# Patient Record
Sex: Male | Born: 1955 | Race: White | Hispanic: No | Marital: Married | Smoking: Never smoker
Health system: Southern US, Community
[De-identification: ages and names within clinical notes are randomized; demographics above are authoritative.]

---

## 2017-06-19 ENCOUNTER — Other Ambulatory Visit: Payer: Self-pay

## 2017-06-19 DIAGNOSIS — N200 Calculus of kidney: Secondary | ICD-10-CM

## 2017-06-19 NOTE — Telephone Encounter (Signed)
Pt requested a refill of hydroxizine and flomax. Pt did not want to make an appt. Pt stated that he just saw you in May. Please advise.

## 2017-06-22 NOTE — Telephone Encounter (Signed)
[facility] chart reviewed-I had recommended a KUB next month for nephrolithiasis.  Orders were put in for refills and KUB.  A pharmacy is not listed.

## 2017-06-25 MED ORDER — TAMSULOSIN HCL 0.4 MG PO CAPS: 0.8000 mg | ORAL_CAPSULE | Freq: Every day | ORAL | 5 refills | Status: DC

## 2017-06-25 MED ORDER — HYDROXYZINE HCL 25 MG PO TABS: 25.0000 mg | ORAL_TABLET | Freq: Every day | ORAL | 5 refills | Status: DC

## 2017-06-25 NOTE — Telephone Encounter (Signed)
LMOM- medication sent to pharmacy. Will need KUB next month.

## 2017-06-29 ENCOUNTER — Telehealth: Payer: Self-pay | Admitting: Urology

## 2017-06-29 NOTE — Telephone Encounter (Signed)
Patient called the office today regarding his prescription hydroxyzine.  The pharmacy filled it at 25mg  and he was currently taking 50mg .    He is going to take 2 tablets at bedtime and would like a new prescription for 50mg  [facility] [address] in [place].    Please contact him if there is a an issue.

## 2017-07-04 MED ORDER — HYDROXYZINE HCL 50 MG PO TABS: 50.0000 mg | ORAL_TABLET | Freq: Every day | ORAL | 5 refills | Status: DC

## 2017-07-04 NOTE — Telephone Encounter (Signed)
Please advise 

## 2017-07-04 NOTE — Telephone Encounter (Signed)
rx was sent

## 2017-09-03 ENCOUNTER — Ambulatory Visit
Admission: RE | Admit: 2017-09-03 | Discharge: 2017-09-03 | Disposition: A | Discharge status: Home / Self Care | Payer: BLUE CROSS/BLUE SHIELD | Source: Ambulatory Visit | Attending: Urology | Admitting: Urology

## 2017-09-03 DIAGNOSIS — N200 Calculus of kidney: Secondary | ICD-10-CM | POA: Insufficient documentation

## 2017-09-19 ENCOUNTER — Telehealth: Payer: Self-pay

## 2017-09-19 NOTE — Telephone Encounter (Signed)
Letter sent.

## 2017-09-19 NOTE — Telephone Encounter (Signed)
-----   Message from Riki AltesScott C Stoioff, MD sent at 09/16/2017 10:42 AM EST ----- When comparing his [facility]UB with [facility] his bilateral renal calculi appear to be stable.  Recommend a repeat KUB in 1 year.

## 2017-12-24 ENCOUNTER — Telehealth: Payer: Self-pay | Admitting: Urology

## 2017-12-24 MED ORDER — TAMSULOSIN HCL 0.4 MG PO CAPS: 0.8000 mg | ORAL_CAPSULE | Freq: Every day | ORAL | 5 refills | Status: DC

## 2017-12-24 NOTE — Telephone Encounter (Signed)
Pt called office LMOM asking for a refill of his Tamsulosin .4mg , states he called for this request 10 days ago to have it "re-sent" Please advise pt @ [phone]. Thanks.

## 2017-12-24 NOTE — Telephone Encounter (Signed)
RX faxed

## 2018-02-12 ENCOUNTER — Telehealth: Payer: Self-pay | Admitting: Urology

## 2018-02-12 MED ORDER — HYDROXYZINE HCL 50 MG PO TABS: 50.0000 mg | ORAL_TABLET | Freq: Every day | ORAL | 1 refills | Status: DC

## 2018-02-12 NOTE — Telephone Encounter (Signed)
Pt scheduled appt w/Stoioff on 8/20.  He will need 1 month of Hydroxyzine [facility] [address]

## 2018-03-12 ENCOUNTER — Ambulatory Visit: Payer: BLUE CROSS/BLUE SHIELD | Admitting: Urology

## 2018-03-12 ENCOUNTER — Other Ambulatory Visit: Payer: Self-pay

## 2018-03-12 ENCOUNTER — Encounter: Payer: Self-pay | Admitting: Urology

## 2018-03-12 VITALS — BP 111/62 | HR 62 | Ht 74.0 in | Wt 222.5 lb

## 2018-03-12 DIAGNOSIS — N200 Calculus of kidney: Secondary | ICD-10-CM | POA: Diagnosis not present

## 2018-03-12 DIAGNOSIS — N401 Enlarged prostate with lower urinary tract symptoms: Secondary | ICD-10-CM

## 2018-03-13 ENCOUNTER — Ambulatory Visit
Admission: RE | Admit: 2018-03-13 | Discharge: 2018-03-13 | Disposition: A | Discharge status: Home / Self Care | Payer: BLUE CROSS/BLUE SHIELD | Source: Ambulatory Visit | Attending: Urology | Admitting: Urology

## 2018-03-13 ENCOUNTER — Encounter: Payer: Self-pay | Admitting: Urology

## 2018-03-13 ENCOUNTER — Telehealth: Payer: Self-pay | Admitting: Urology

## 2018-03-13 DIAGNOSIS — N200 Calculus of kidney: Secondary | ICD-10-CM

## 2018-03-13 LAB — PSA: Prostate Specific Ag, Serum: 2.7 ng/mL (ref 0.0–4.0)

## 2018-03-13 MED ORDER — MIRABEGRON ER 25 MG PO TB24: 25.0000 mg | ORAL_TABLET | Freq: Every day | ORAL | 0 refills | Status: DC

## 2018-03-13 NOTE — Telephone Encounter (Signed)
Patient was seen in the office on 03/12/18 and left without receiving samples of Myrbetriq 25mg .    I pulled 4 sleeves and placed up front for the patient to pick up. Lot: [id number], Exp 04/2020

## 2018-03-13 NOTE — Telephone Encounter (Signed)
KUB order needs to be placed per office note 03/12/18 - patient coming today

## 2018-03-13 NOTE — Addendum Note (Signed)
Addended by: Vickki HearingHOMPSON, Liela Rylee L on: 03/13/2018 12:05 PM   Modules accepted: Orders

## 2018-03-13 NOTE — Progress Notes (Signed)
03/12/2018 7:03 AM   Shawn Orr 05/01/1956 [id number]  Referring provider: Tiana Loftabellon, Melissa, MD [address] [place], [place] [id number]  Chief Complaint  Patient presents with  . Nephrolithiasis   Urologic history: 1.  Bilateral nonobstructing renal calculi; on observation  2.  BPH with lower urinary tract symptoms; on tamsulosin  3.  Prior diagnosis of interstitial cystitis many years ago; takes MRI and periodically along with hydroxyzine and diet   HPI: 62 year old male [facility] problem list.  I last saw him in June 2018.  He had a KUB February 2019 which showed stable bilateral renal calculi.  He remains on tamsulosin 0.8 mg daily.  He does complain of urinary frequency, intermittent urinary stream, hesitancy and nocturia x3-4.  Denies dysuria or gross hematuria.  Since his last visit he has had no episodes of renal colic or denies passing calculi.  He denies pelvic pain.  Last PSA was in February 2017 and was 2.35.  PMH: History reviewed. No pertinent past medical history.  Surgical History: History reviewed. No pertinent surgical history.  Home Medications:  Allergies as of 03/12/2018      Reactions   Minocycline Rash      Medication List        Accurate as of 03/12/18 11:59 PM. Always use your most recent med list.          hydrOXYzine 50 MG tablet Commonly known as:  ATARAX/VISTARIL Take 1 tablet (50 mg total) by mouth at bedtime.   tamsulosin 0.4 MG Caps capsule Commonly known as:  FLOMAX Take 2 capsules (0.8 mg total) by mouth daily.       Allergies:  Allergies  Allergen Reactions  . Minocycline Rash    Family History: History reviewed. No pertinent family history.  Social History:  reports that he has never smoked. He has never used smokeless tobacco. He reports that he does not drink alcohol or use drugs.  ROS: UROLOGY Frequent Urination?: Yes Hard to postpone urination?: No Burning/pain  with urination?: No Get up at night to urinate?: Yes Leakage of urine?: Yes Urine stream starts and stops?: Yes Trouble starting stream?: Yes Do you have to strain to urinate?: No Blood in urine?: No Urinary tract infection?: No Sexually transmitted disease?: No Injury to kidneys or bladder?: No Painful intercourse?: No Weak stream?: No Erection problems?: No Penile pain?: No  Gastrointestinal Nausea?: No Vomiting?: No Indigestion/heartburn?: No Diarrhea?: No Constipation?: No  Constitutional Fever: No Night sweats?: No Weight loss?: No Fatigue?: No  Skin Skin rash/lesions?: No Itching?: No  Eyes Blurred vision?: No Double vision?: No  Ears/Nose/Throat Sore throat?: No Sinus problems?: No  Hematologic/Lymphatic Swollen glands?: No Easy bruising?: No  Cardiovascular Leg swelling?: No Chest pain?: No  Respiratory Cough?: No Shortness of breath?: No  Endocrine Excessive thirst?: No  Musculoskeletal Back pain?: Yes Joint pain?: No  Neurological Headaches?: No Dizziness?: No  Psychologic Depression?: No Anxiety?: No  Physical Exam: BP 111/62   Pulse 62   Ht 6\' 2"  (1.88 m)   Wt 222 lb 8 oz (100.9 kg)   BMI 28.57 kg/m   Constitutional:  Alert and oriented, No acute distress. HEENT:  AT, moist mucus membranes.  Trachea midline, no masses. Cardiovascular: No clubbing, cyanosis, or edema. Respiratory: Normal respiratory effort, no increased work of breathing. GI: Abdomen is soft, nontender, nondistended, no abdominal masses GU: No CVA tenderness.  Prostate 40 g, smooth without nodules Lymph: No cervical or  inguinal lymphadenopathy. Skin: No rashes, bruises or suspicious lesions. Neurologic: Grossly intact, no focal deficits, moving all 4 extremities. Psychiatric: Normal mood and affect.   Assessment & Plan:    1. Benign prostatic hyperplasia with lower urinary tract symptoms, symptom details unspecified Continue tamsulosin.  He does  have bothersome frequency and nocturia.  He has been on Myrbetriq 50 mg in the past however complained of a headache.  He wanted to try the 25 mg dose and was given samples.  He desired to continue PSA testing and will be notified with the results.  He will continue hydroxyzine as he does appear to get some benefit from this medication.  2. Nephrolithiasis KUB was ordered and he will be notified with the results.   Riki AltesScott C Stoioff, MD  [facility] [address], [address] [place], [place] [address] [phone]

## 2018-03-15 ENCOUNTER — Ambulatory Visit
Admission: RE | Admit: 2018-03-15 | Discharge: 2018-03-15 | Disposition: A | Discharge status: Home / Self Care | Payer: BLUE CROSS/BLUE SHIELD | Source: Ambulatory Visit | Attending: Urology | Admitting: Urology

## 2018-03-15 DIAGNOSIS — N200 Calculus of kidney: Secondary | ICD-10-CM | POA: Diagnosis present

## 2018-04-08 ENCOUNTER — Encounter: Payer: Self-pay | Admitting: Urology

## 2018-04-08 ENCOUNTER — Other Ambulatory Visit: Payer: Self-pay | Admitting: Urology

## 2018-04-08 MED ORDER — MIRABEGRON ER 25 MG PO TB24: 25.0000 mg | ORAL_TABLET | Freq: Every day | ORAL | 3 refills | Status: DC

## 2018-04-11 ENCOUNTER — Telehealth: Payer: Self-pay | Admitting: Urology

## 2018-04-11 NOTE — Telephone Encounter (Signed)
Patient is calling and asking for a refill on hydroxyzine, he said that the pharmacy states they sent the request over on the 16th? Wants to know if this can be refilled?  Shawn Orr

## 2018-04-14 MED ORDER — HYDROXYZINE HCL 50 MG PO TABS: 50.0000 mg | ORAL_TABLET | Freq: Every day | ORAL | 3 refills | Status: AC

## 2018-04-14 NOTE — Telephone Encounter (Signed)
rx sent

## 2018-05-27 ENCOUNTER — Encounter: Payer: Self-pay | Admitting: Urology

## 2018-06-27 ENCOUNTER — Telehealth: Payer: Self-pay | Admitting: Urology

## 2018-06-27 NOTE — Telephone Encounter (Signed)
Pt would like to get some more samples of Myrbetriq 25 mg.  Please call pt.  He also wants to know if Elmiron would be something Stoioff could prescribe.  He had an RX from his former urologist.  Please call pt [phone]

## 2018-06-27 NOTE — Telephone Encounter (Signed)
Patient saw you in Aug. Of this year. Please advise on Elmiron?

## 2018-06-30 MED ORDER — PENTOSAN POLYSULFATE SODIUM 100 MG PO CAPS: 100.0000 mg | ORAL_CAPSULE | Freq: Three times a day (TID) | ORAL | 3 refills | Status: AC

## 2018-06-30 NOTE — Telephone Encounter (Signed)
Rx Elmiron sent

## 2018-07-03 ENCOUNTER — Encounter: Payer: Self-pay | Admitting: Urology

## 2018-07-03 ENCOUNTER — Other Ambulatory Visit: Payer: Self-pay

## 2018-07-03 MED ORDER — TAMSULOSIN HCL 0.4 MG PO CAPS: 0.8000 mg | ORAL_CAPSULE | Freq: Every day | ORAL | 3 refills | Status: AC

## 2018-07-03 MED ORDER — MIRABEGRON ER 25 MG PO TB24: 25.0000 mg | ORAL_TABLET | Freq: Every day | ORAL | 3 refills | Status: AC

## 2018-10-03 MED ORDER — ATORVASTATIN CALCIUM 80 MG PO TABS
80.00 | ORAL_TABLET | ORAL | Status: DC
Start: 2018-10-02 — End: 2018-10-03

## 2018-10-03 MED ORDER — ONDANSETRON 4 MG PO TBDP
8.00 | ORAL_TABLET | ORAL | Status: DC
Start: ? — End: 2018-10-03

## 2018-10-03 MED ORDER — HYDROMORPHONE HCL 2 MG PO TABS
8.00 | ORAL_TABLET | ORAL | Status: DC
Start: ? — End: 2018-10-03

## 2018-10-03 MED ORDER — GENERIC EXTERNAL MEDICATION
2.00 | Status: DC
Start: 2018-10-02 — End: 2018-10-03

## 2018-10-03 MED ORDER — TAMSULOSIN HCL 0.4 MG PO CAPS
0.40 | ORAL_CAPSULE | ORAL | Status: DC
Start: 2018-10-03 — End: 2018-10-03

## 2018-10-03 MED ORDER — FENTANYL 50 MCG/HR TD PT72
1.00 | MEDICATED_PATCH | TRANSDERMAL | Status: DC
Start: 2018-10-03 — End: 2018-10-03

## 2018-10-03 MED ORDER — METOPROLOL TARTRATE 25 MG PO TABS
12.50 | ORAL_TABLET | ORAL | Status: DC
Start: 2018-10-02 — End: 2018-10-03

## 2018-10-03 MED ORDER — PANTOPRAZOLE SODIUM 20 MG PO TBEC
20.00 | DELAYED_RELEASE_TABLET | ORAL | Status: DC
Start: 2018-10-03 — End: 2018-10-03

## 2018-10-03 MED ORDER — DRONABINOL 5 MG PO CAPS
5.00 | ORAL_CAPSULE | ORAL | Status: DC
Start: 2018-10-02 — End: 2018-10-03

## 2018-10-03 MED ORDER — ZOLPIDEM TARTRATE 5 MG PO TABS
5.00 | ORAL_TABLET | ORAL | Status: DC
Start: 2018-10-02 — End: 2018-10-03

## 2018-10-03 MED ORDER — POLYETHYLENE GLYCOL 3350 17 G PO PACK
17.00 | PACK | ORAL | Status: DC
Start: 2018-10-02 — End: 2018-10-03

## 2018-10-03 MED ORDER — SUCRALFATE 1 GM/10ML PO SUSP
1.00 | ORAL | Status: DC
Start: 2018-10-02 — End: 2018-10-03

## 2018-10-03 MED ORDER — PROCHLORPERAZINE MALEATE 10 MG PO TABS
10.00 | ORAL_TABLET | ORAL | Status: DC
Start: ? — End: 2018-10-03

## 2018-10-03 MED ORDER — DOCUSATE SODIUM 100 MG PO CAPS
100.00 | ORAL_CAPSULE | ORAL | Status: DC
Start: 2018-10-03 — End: 2018-10-03

## 2018-10-03 MED ORDER — APIXABAN 5 MG PO TABS
5.00 | ORAL_TABLET | ORAL | Status: DC
Start: 2018-10-02 — End: 2018-10-03

## 2018-10-03 MED ORDER — ACETAMINOPHEN 500 MG PO TABS
1000.00 | ORAL_TABLET | ORAL | Status: DC
Start: 2018-10-02 — End: 2018-10-03

## 2019-01-26 IMAGING — CR DG ABDOMEN 1V
1 series · 2 of 2 positions shown · non-contrast
Comparison: 09/03/2017 KUB.

CLINICAL DATA: 62-year-old male with nephrolithiasis. Frequent
urination.

EXAM:
ABDOMEN - 1 VIEW

[Series 1: dg abd 1 view · 0.14mm/px · 2 of 2 slices shown]
[im 1/2]
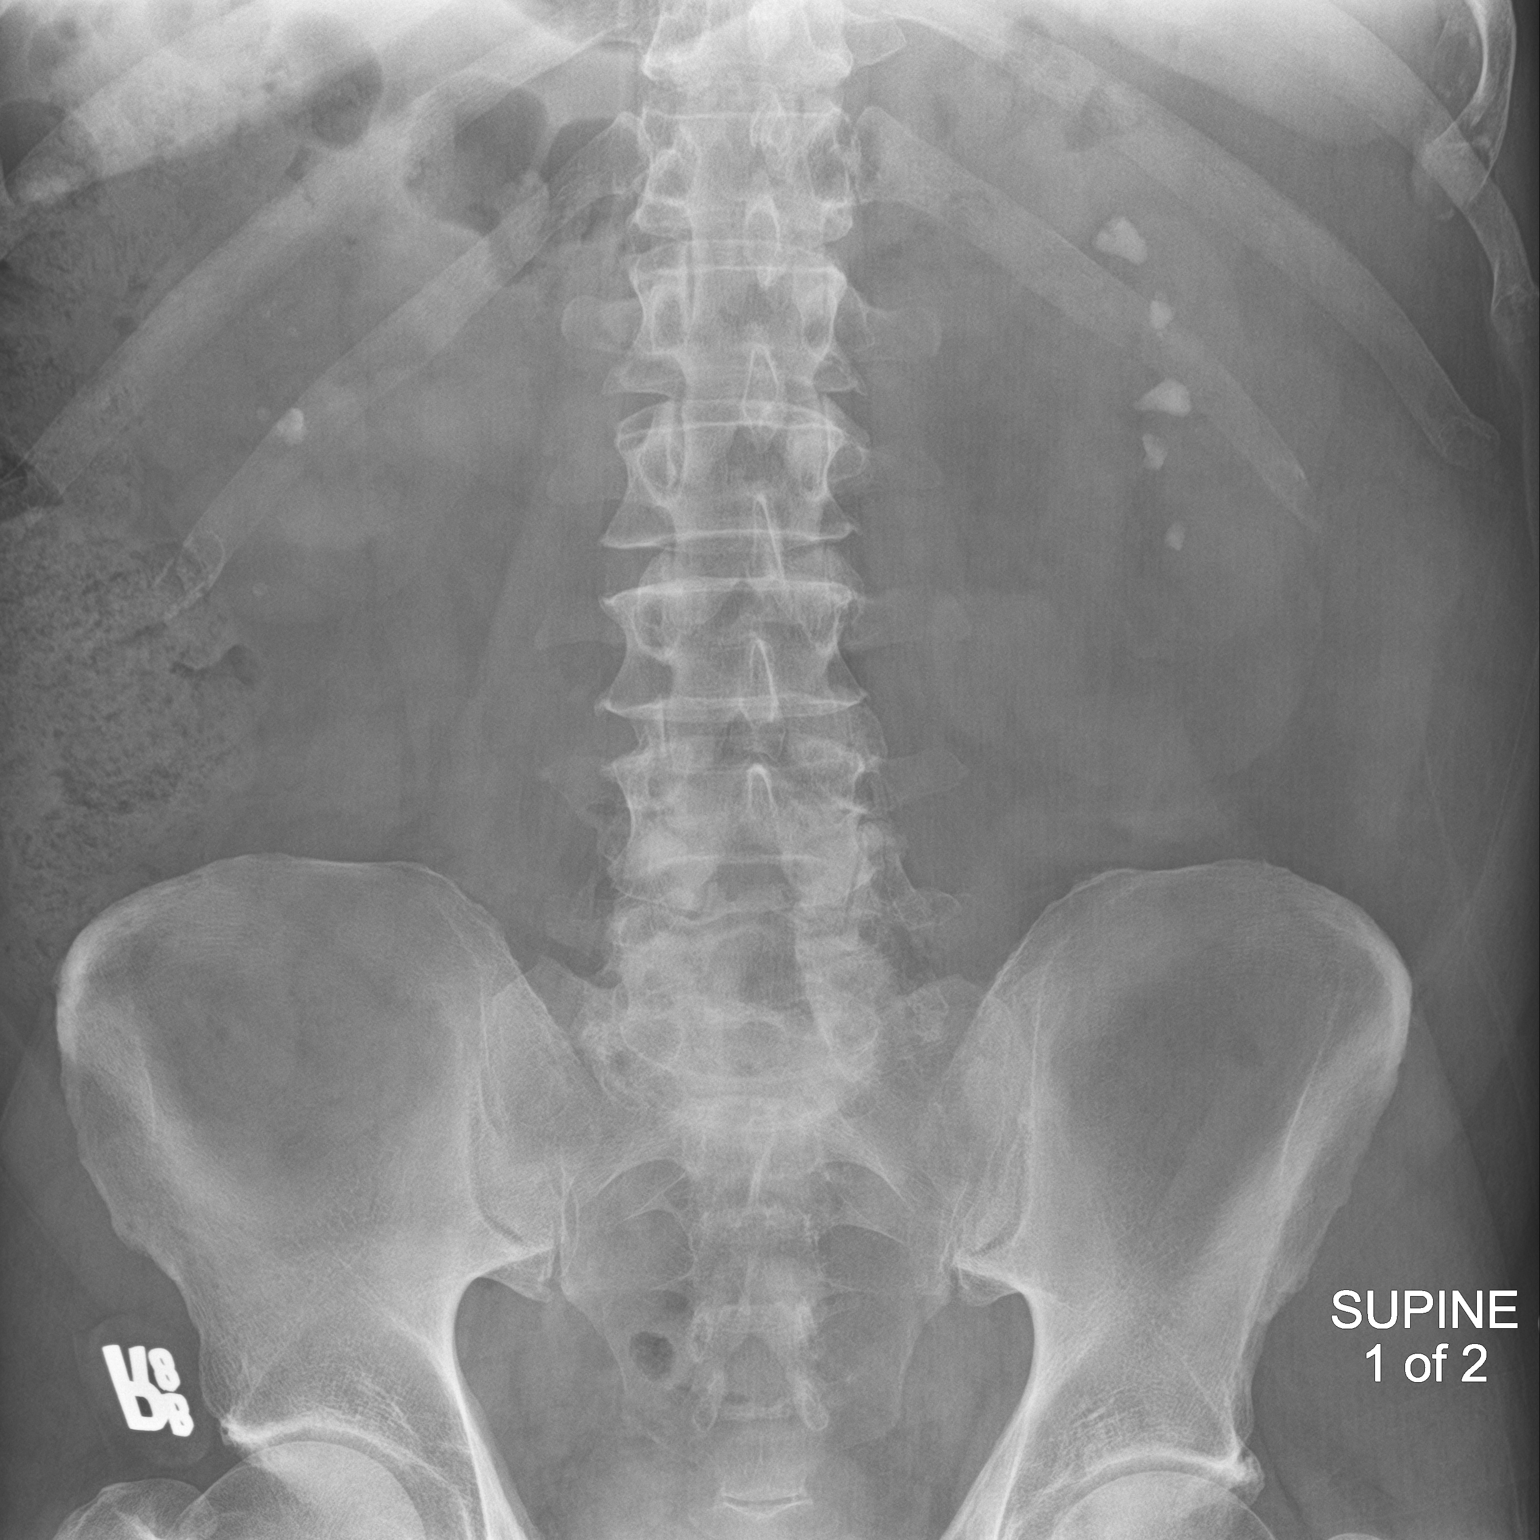
[im 2/2]
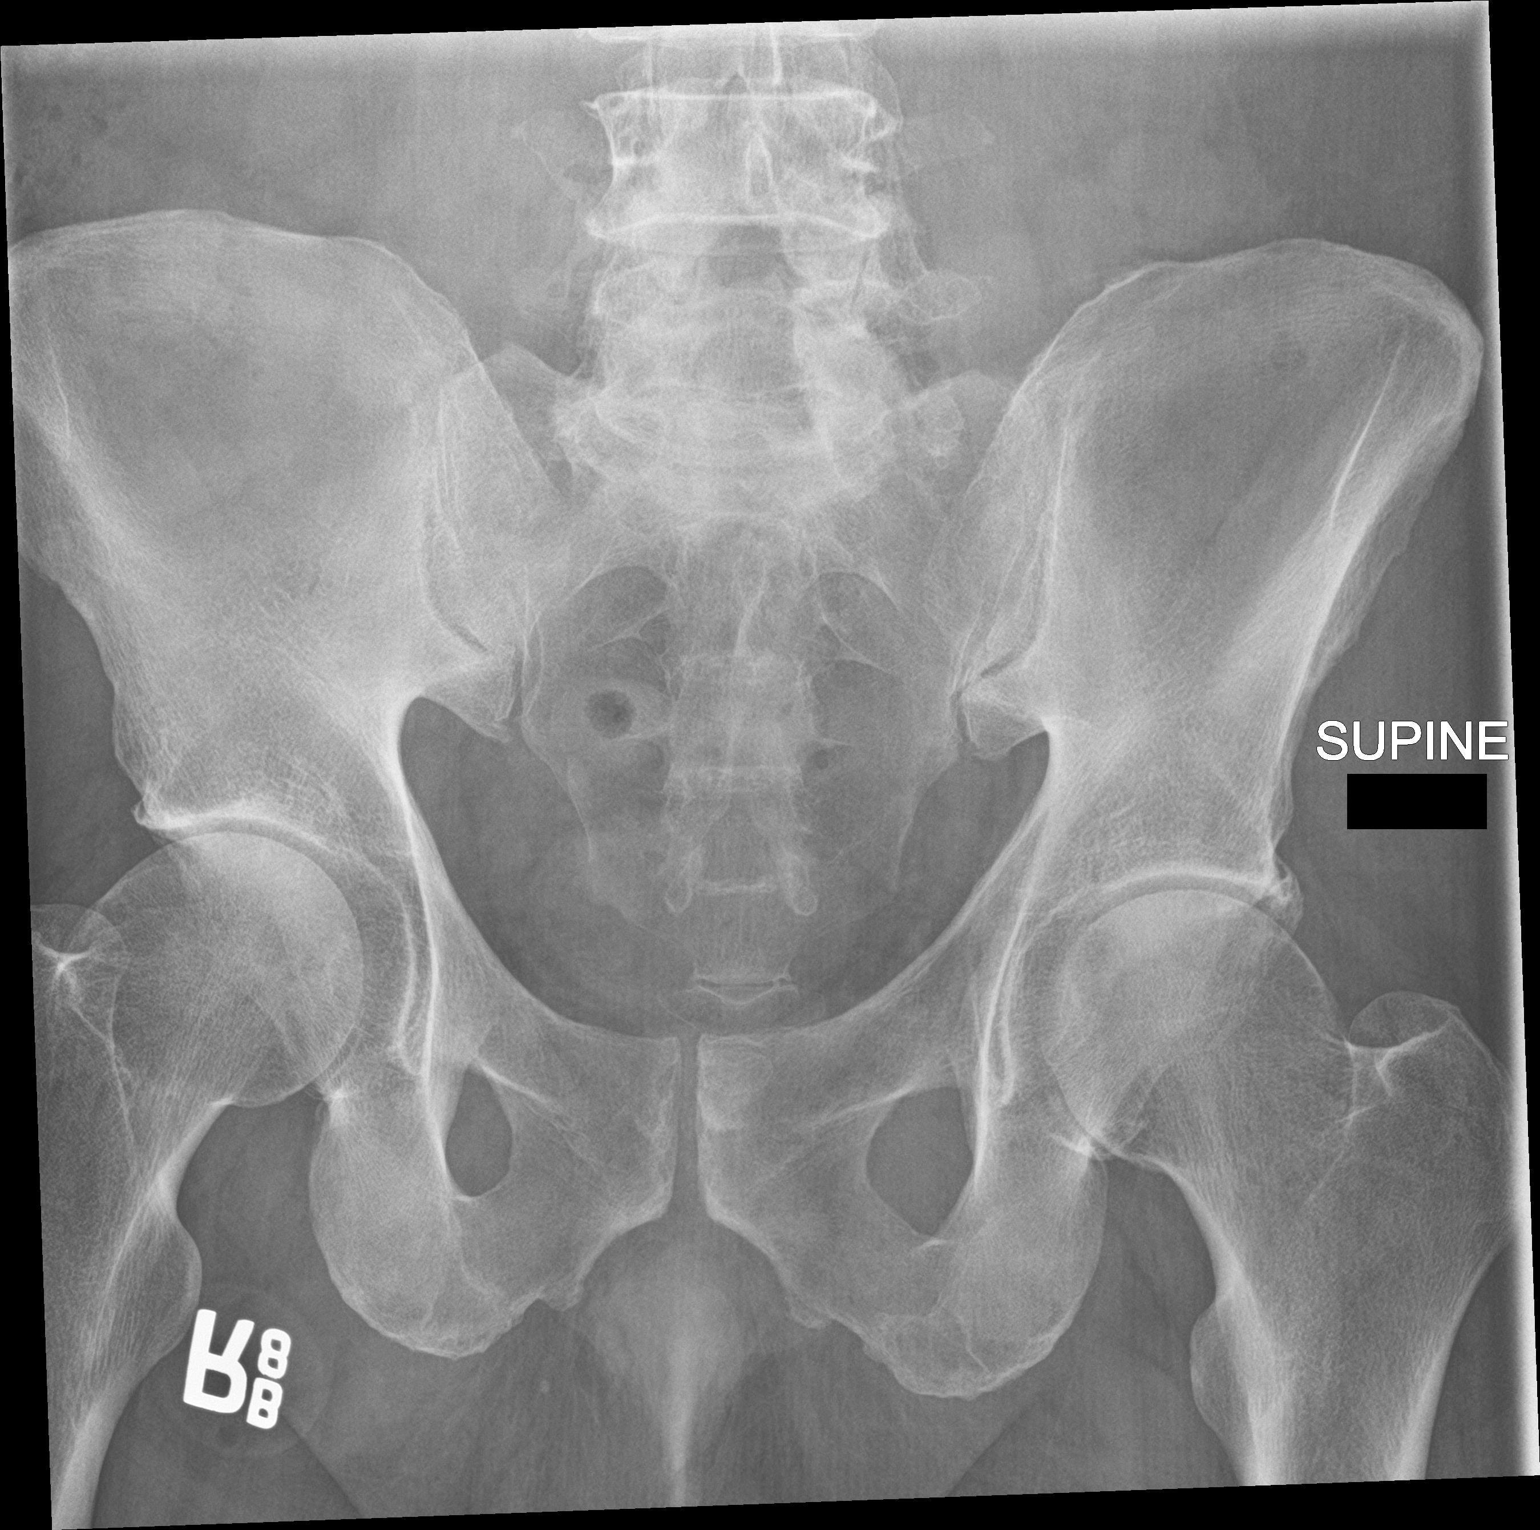

[2 of 2 positions shown; findings below may reference images not displayed]

FINDINGS: Multiple bulky renal calculi redemonstrated, left greater than
right. Left nephrolithiasis measures up to 15 millimeters. Right
nephrolithiasis measures up to 10 millimeters. Multiple 2-3
millimeter calculi also demonstrated bilaterally.

No calculus identified along the course of either ureter or in the
pelvis.

Normal visible bowel gas pattern and other abdominal visceral
contours. No acute osseous abnormality identified.
IMPRESSION: Stable radiographic appearance of bulky bilateral nephrolithiasis
left greater than right.
